# Patient Record
Sex: Female | Born: 2005 | Race: White | Hispanic: No | Marital: Single | Smoking: Never smoker
Health system: Southern US, Community
[De-identification: ages and names within clinical notes are randomized; demographics above are authoritative.]

## PROBLEM LIST (undated history)

## (undated) HISTORY — PX: TONSILLECTOMY: SHX5217

---
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
## 2024-01-13 IMAGING — CR DG KNEE COMPLETE 4+V*L*
1 series · 4 of 4 positions shown · non-contrast
Comparison: None Available.

CLINICAL DATA: Posterior knee pain

EXAM:
LEFT KNEE - COMPLETE 4+ VIEW

[Series 1: dg knee complete 4 views left · 0.14mm/px · 4 of 4 slices shown]
[im 1/4]
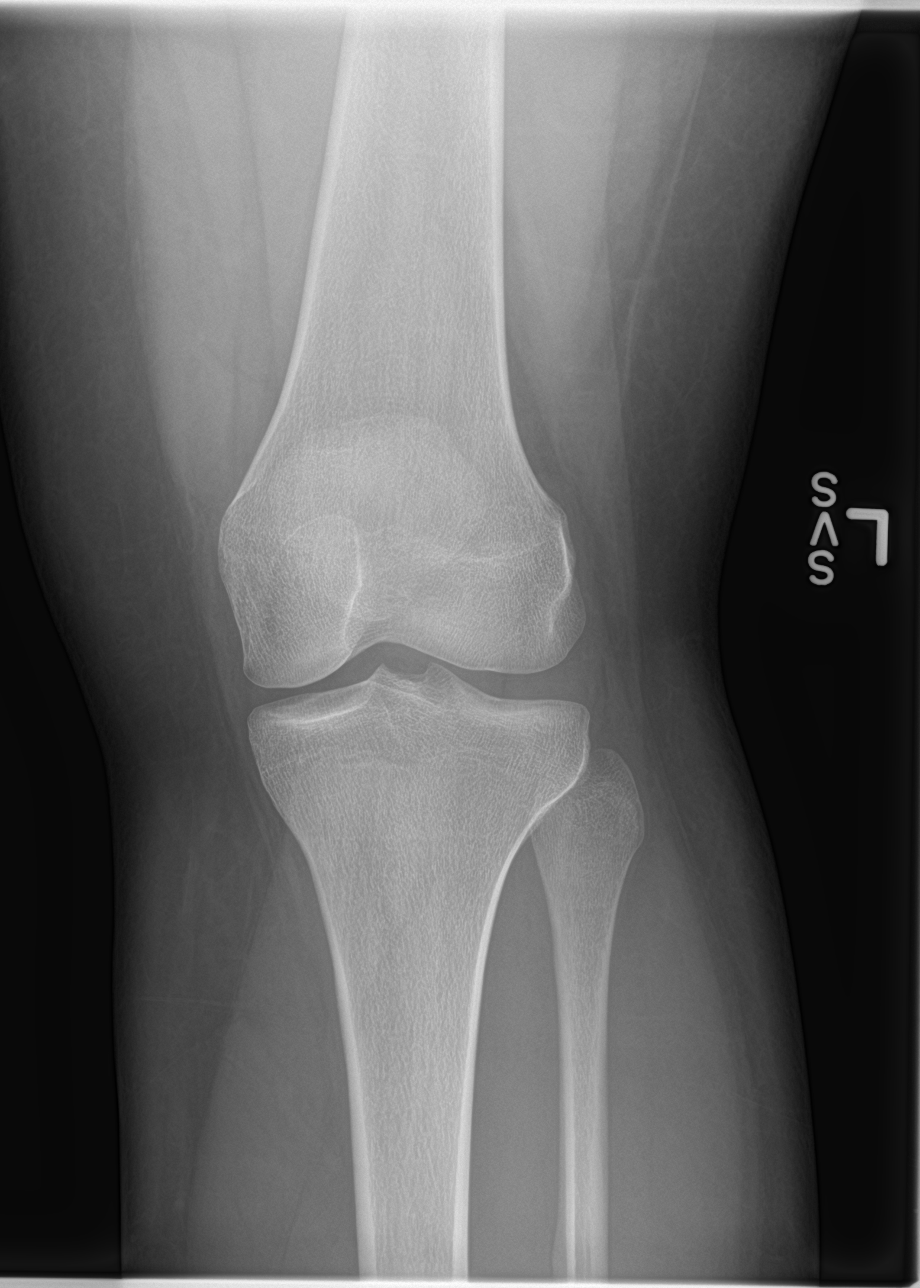
[im 2/4]
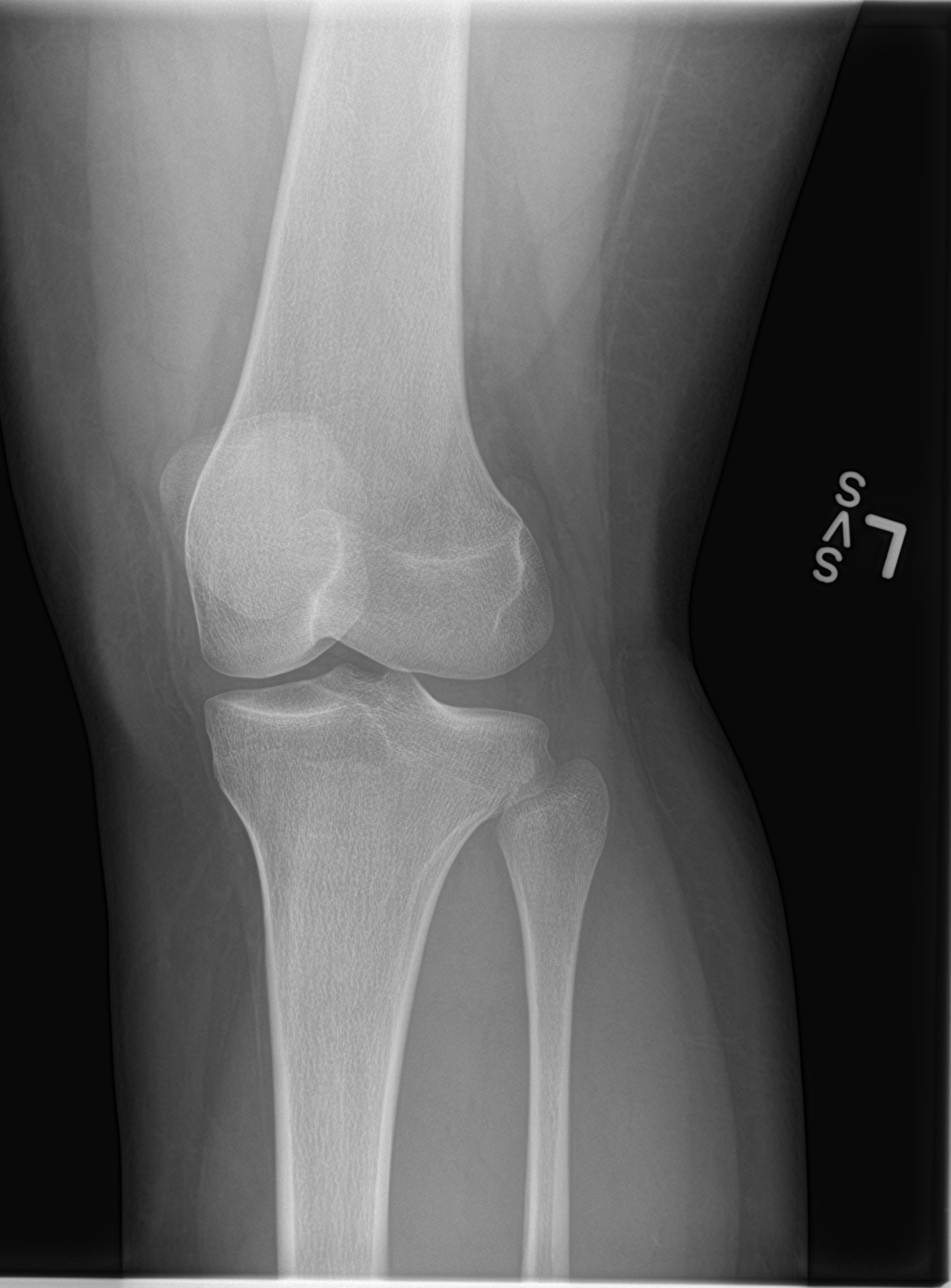
[im 3/4]
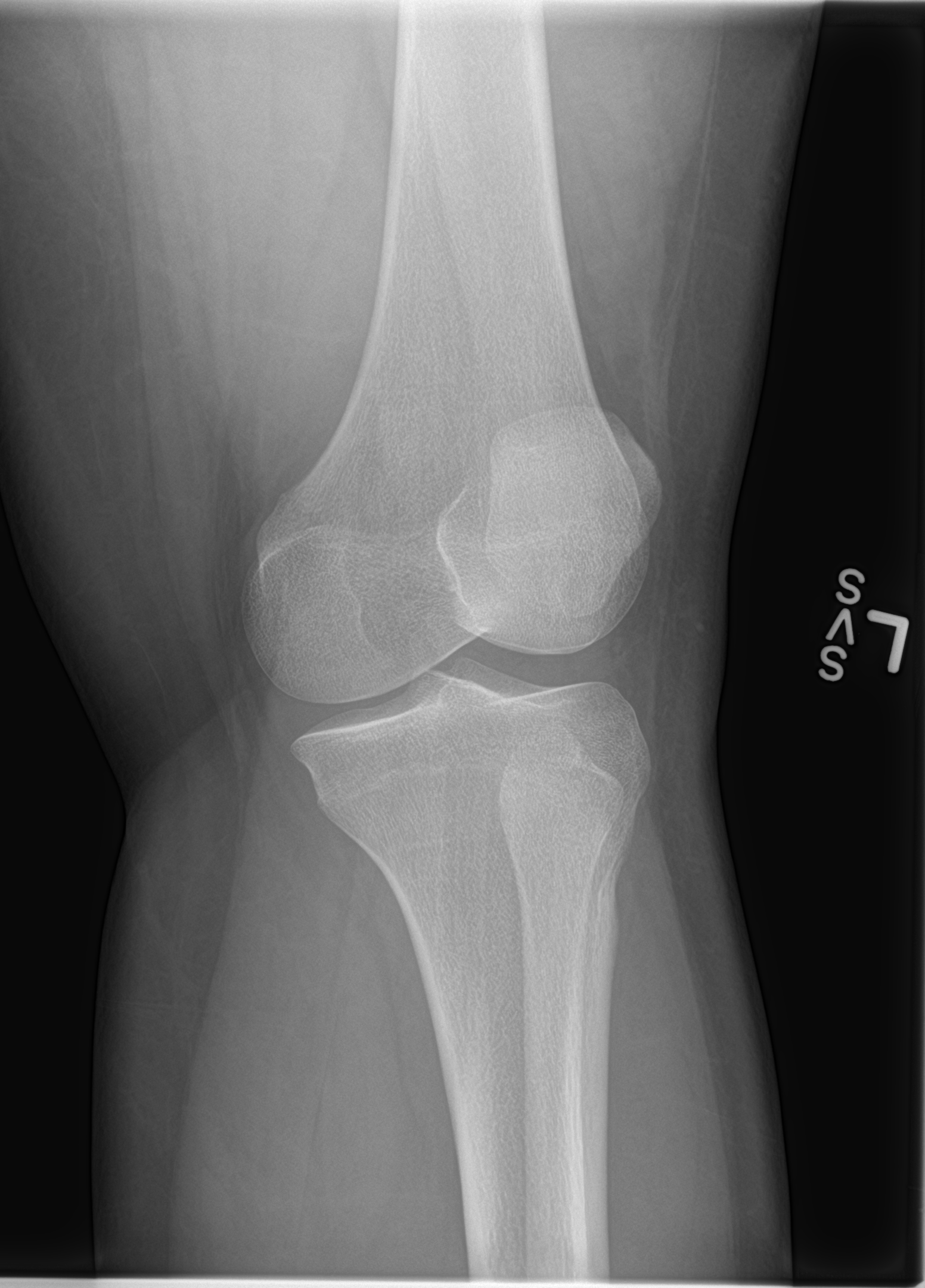
[im 4/4]
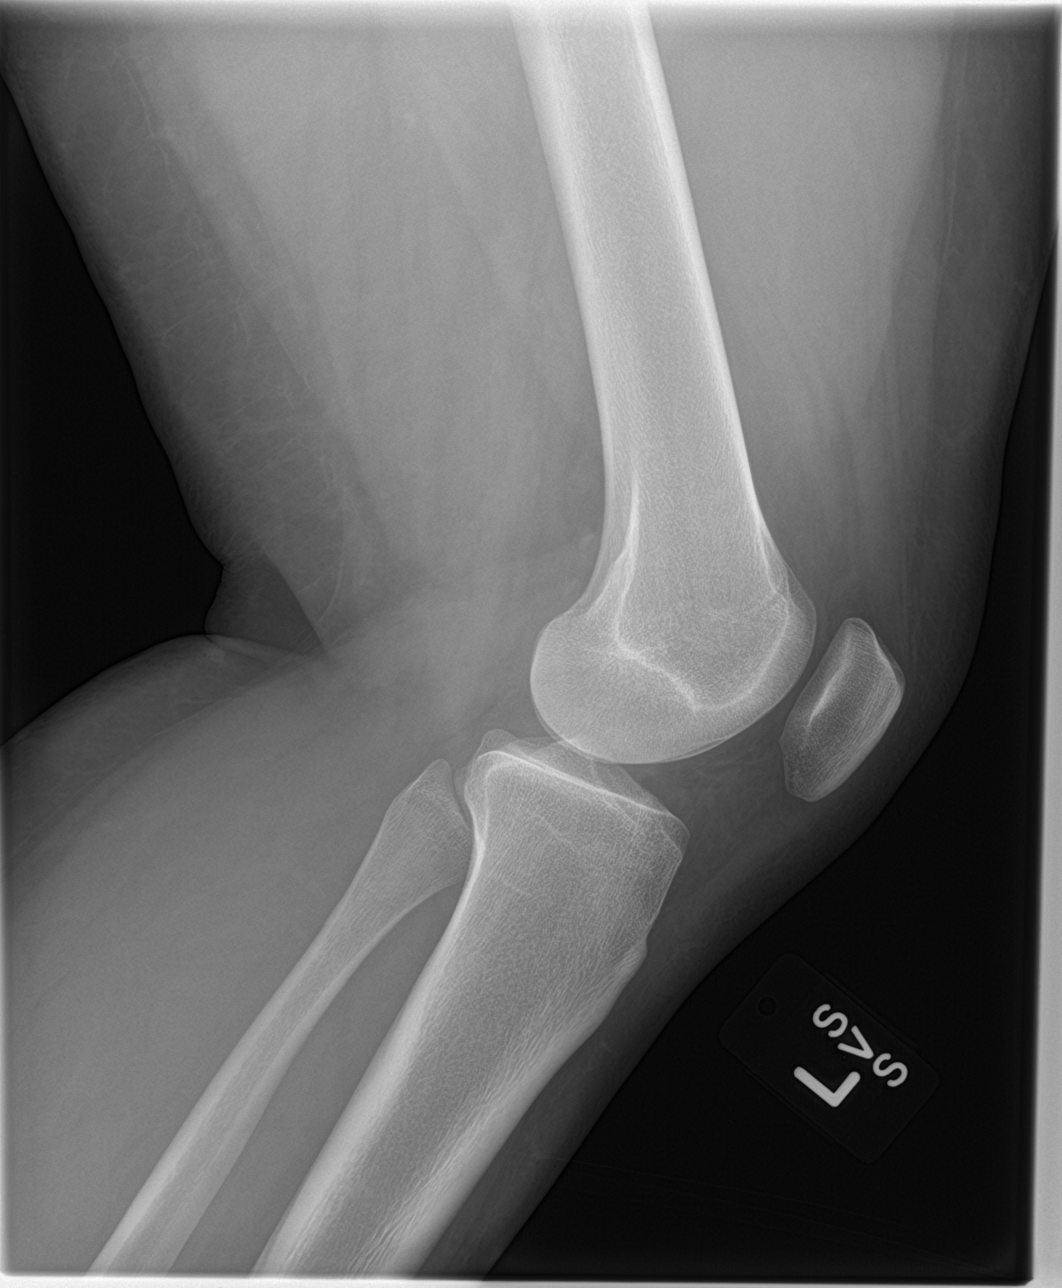

[4 of 4 positions shown; findings below may reference images not displayed]

FINDINGS: No evidence of fracture, dislocation, or joint effusion. No evidence
of arthropathy or other focal bone abnormality. Soft tissues are
unremarkable.
IMPRESSION: Negative.
